# Patient Record
Sex: Male | Born: 1980 | Race: White | Hispanic: No | Marital: Single | Smoking: Never smoker
Health system: Southern US, Community
[De-identification: ages and names within clinical notes are randomized; demographics above are authoritative.]

---

## 2013-09-23 ENCOUNTER — Emergency Department (HOSPITAL_COMMUNITY)
Admission: EM | Admit: 2013-09-23 | Discharge: 2013-09-23 | Disposition: A | Discharge status: Home / Self Care | Payer: Self-pay | Attending: Emergency Medicine | Admitting: Emergency Medicine

## 2013-09-23 ENCOUNTER — Emergency Department (HOSPITAL_COMMUNITY): Payer: Self-pay

## 2013-09-23 ENCOUNTER — Encounter (HOSPITAL_COMMUNITY): Payer: Self-pay | Admitting: Emergency Medicine

## 2013-09-23 DIAGNOSIS — S99919A Unspecified injury of unspecified ankle, initial encounter: Secondary | ICD-10-CM

## 2013-09-23 DIAGNOSIS — S8990XA Unspecified injury of unspecified lower leg, initial encounter: Secondary | ICD-10-CM | POA: Insufficient documentation

## 2013-09-23 DIAGNOSIS — S92309A Fracture of unspecified metatarsal bone(s), unspecified foot, initial encounter for closed fracture: Secondary | ICD-10-CM | POA: Insufficient documentation

## 2013-09-23 DIAGNOSIS — S92302A Fracture of unspecified metatarsal bone(s), left foot, initial encounter for closed fracture: Secondary | ICD-10-CM

## 2013-09-23 DIAGNOSIS — E669 Obesity, unspecified: Secondary | ICD-10-CM | POA: Insufficient documentation

## 2013-09-23 DIAGNOSIS — Y939 Activity, unspecified: Secondary | ICD-10-CM | POA: Insufficient documentation

## 2013-09-23 DIAGNOSIS — S99929A Unspecified injury of unspecified foot, initial encounter: Secondary | ICD-10-CM

## 2013-09-23 DIAGNOSIS — Z791 Long term (current) use of non-steroidal anti-inflammatories (NSAID): Secondary | ICD-10-CM | POA: Insufficient documentation

## 2013-09-23 DIAGNOSIS — Y9289 Other specified places as the place of occurrence of the external cause: Secondary | ICD-10-CM | POA: Insufficient documentation

## 2013-09-23 DIAGNOSIS — W172XXA Fall into hole, initial encounter: Secondary | ICD-10-CM | POA: Insufficient documentation

## 2013-09-23 MED ORDER — IBUPROFEN 800 MG PO TABS
800.0000 mg | ORAL_TABLET | Freq: Once | ORAL | Status: AC
  Administered 2013-09-23: 800 mg via ORAL
  Filled 2013-09-23: qty 1

## 2013-09-23 MED ORDER — IBUPROFEN 800 MG PO TABS: 800.0000 mg | ORAL_TABLET | Freq: Three times a day (TID) | ORAL | Status: AC

## 2013-09-23 MED ORDER — OXYCODONE-ACETAMINOPHEN 5-325 MG PO TABS
1.0000 | ORAL_TABLET | Freq: Once | ORAL | Status: AC
  Administered 2013-09-23: 1 via ORAL
  Filled 2013-09-23: qty 1

## 2013-09-23 MED ORDER — OXYCODONE-ACETAMINOPHEN 5-325 MG PO TABS: 1.0000 | ORAL_TABLET | ORAL | Status: AC | PRN

## 2013-09-23 NOTE — ED Notes (Signed)
Applied ASO, post op shoe, and crutches. Supervised by [employer] Financial[employer].

## 2013-09-23 NOTE — ED Notes (Signed)
Having left foot pain. Larey SeatFell in a hole that the dog had dug per family.

## 2013-09-23 NOTE — Discharge Instructions (Signed)
Metatarsal Fracture, Undisplaced  A metatarsal fracture is a break in the bone(s) of the foot. These are the bones of the foot that connect your toes to the bones of the ankle.  DIAGNOSIS   The diagnoses of these fractures are usually made with X-rays. If there are problems in the forefoot and x-rays are normal a later bone scan will usually make the diagnosis.   TREATMENT AND HOME CARE INSTRUCTIONS  · Treatment may or may not include a cast or walking shoe. When casts are needed the use is usually for short periods of time so as not to slow down healing with muscle wasting (atrophy).  · Activities should be stopped until further advised by your caregiver.  · Wear shoes with adequate shock absorbing capabilities and stiff soles.  · Alternative exercise may be undertaken while waiting for healing. These may include bicycling and swimming, or as your caregiver suggests.  · It is important to keep all follow-up visits or specialty referrals. The failure to keep these appointments could result in improper bone healing and chronic pain or disability.  · Warning: Do not drive a car or operate a motor vehicle until your caregiver specifically tells you it is safe to do so.  IF YOU DO NOT HAVE A CAST OR SPLINT:  · You may walk on your injured foot as tolerated or advised.  · Do not put any weight on your injured foot for as long as directed by your caregiver. Slowly increase the amount of time you walk on the foot as the pain allows or as advised.  · Use crutches until you can bear weight without pain. A gradual increase in weight bearing may help.  · Apply ice to the injury for 15-20 minutes each hour while awake for the first 2 days. Put the ice in a plastic bag and place a towel between the bag of ice and your skin.  · Only take over-the-counter or prescription medicines for pain, discomfort, or fever as directed by your caregiver.  SEEK IMMEDIATE MEDICAL CARE IF:   · Your cast gets damaged or breaks.  · You have  continued severe pain or more swelling than you did before the cast was put on, or the pain is not controlled with medications.  · Your skin or nails below the injury turn blue or grey, or feel cold or numb.  · There is a bad smell, or new stains or pus-like (purulent) drainage coming from the cast.  MAKE SURE YOU:   · Understand these instructions.  · Will watch your condition.  · Will get help right away if you are not doing well or get worse.  Document Released: 10/21/2001 Document Revised: 04/23/2011 Document Reviewed: 09/12/2007  [employer]® Patient Information ©2015 [employer]. This information is not intended to replace advice given to you by your health care provider. Make sure you discuss any questions you have with your health care provider.

## 2013-09-25 NOTE — ED Provider Notes (Signed)
CSN: [id number]     Arrival date & time 09/23/13  2125 History   First MD Initiated Contact with Patient 09/23/13 2155     Chief Complaint  Patient presents with  . Foot Pain     (Consider location/radiation/quality/duration/timing/severity/associated sxs/prior Treatment) Patient is a 33 y.o. male presenting with lower extremity pain. The history is provided by the patient.  Foot Pain Associated symptoms include arthralgias and joint swelling. Pertinent negatives include no chills, fever, neck pain, numbness or weakness.  Nathanuel Cabreja is a 33 y.o. male who presents to the Emergency Department complaining of pain to the left ankle and foot.  He states that he fell in a hole in the yard one day prior to arrival and has been having pain to the outside of his ankle and foot since the fall.  He reports swelling to his foot.  He denies numbness or weakness of the leg, knee pain or other injuries.  Pain is worse with weight bearing and improves with rest.  He has not taken any medications for the pain.    History reviewed. No pertinent past medical history. History reviewed. No pertinent past surgical history. No family history on file. History  Substance Use Topics  . Smoking status: Never Smoker   . Smokeless tobacco: Not on file  . Alcohol Use: No    Review of Systems  Constitutional: Negative for fever and chills.  Genitourinary: Negative for dysuria and difficulty urinating.  Musculoskeletal: Positive for arthralgias and joint swelling. Negative for back pain and neck pain.  Skin: Negative for color change and wound.  Neurological: Negative for dizziness, weakness and numbness.  All other systems reviewed and are negative.     Allergies  Review of patient's allergies indicates no known allergies.  Home Medications   Prior to Admission medications   Medication Sig Start Date End Date Taking? Authorizing Provider  ibuprofen (ADVIL,MOTRIN) 800 MG tablet Take 1 tablet (800 mg  total) by mouth 3 (three) times daily. 09/23/13   Aadin Gaut L. Tasean Mancha, PA-C  oxyCODONE-acetaminophen (PERCOCET/ROXICET) 5-325 MG per tablet Take 1 tablet by mouth every 4 (four) hours as needed. 09/23/13   Said Rueb L. Shalaine Payson, PA-C   BP 149/84  Pulse 91  Temp(Src) 98.3 F (36.8 C) (Oral)  Resp 18  Ht 6\' 4"  (1.93 m)  Wt 415 lb (188.243 kg)  BMI 50.54 kg/m2  SpO2 99% Physical Exam  Nursing note and vitals reviewed. Constitutional: He is oriented to person, place, and time. He appears well-developed and well-nourished. No distress.  Pt is obese  HENT:  Head: Normocephalic and atraumatic.  Neck: Normal range of motion. Neck supple.  Cardiovascular: Normal rate, regular rhythm, normal heart sounds and intact distal pulses.   No murmur heard. Pulmonary/Chest: Effort normal and breath sounds normal. No respiratory distress.  Musculoskeletal: He exhibits edema and tenderness.  ttp of the lateral left foot with localized edema at the proximal fifth MT joint.  mild STS and tenderness at the lateral malleolus. DP pulse is brisk,distal sensation intact.  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  No proximal tenderness.  Neurological: He is alert and oriented to person, place, and time. He exhibits normal muscle tone. Coordination normal.  Skin: Skin is warm and dry.    ED Course  Procedures (including critical care time) Labs Review Labs Reviewed - No data to display  Imaging Review Dg Ankle Complete Left  09/23/2013   CLINICAL DATA:  Left lateral foot and ankle pain and swelling.  EXAM: LEFT ANKLE COMPLETE - 3+ VIEW  COMPARISON:  None.  FINDINGS: There is no evidence of fracture or dislocation. The ankle mortise is intact; the interosseous space is within normal limits. No talar tilt or subluxation is seen. A small posterior calcaneal spur is seen.  The joint spaces are preserved. No significant soft tissue abnormalities are seen.  IMPRESSION: No evidence of fracture or dislocation.    Electronically Signed   By: Roanna RaiderJeffery  Chang M.D.   On: 09/23/2013 22:55   Dg Foot Complete Left  09/23/2013   CLINICAL DATA:  Left foot pain.  EXAM: LEFT FOOT - COMPLETE 3+ VIEW  COMPARISON:  None.  FINDINGS: There appears to be a Jones fracture through the base of the fifth metatarsal, with mild displacement. No additional fractures are seen.  The joint spaces are preserved. There is no evidence of talar subluxation; the subtalar joint is unremarkable in appearance. Plantar and posterior calcaneal spurs are seen.  No significant soft tissue abnormalities are seen.  IMPRESSION: Apparent Jones fracture through the base of the fifth metatarsal, with mild displacement.   Electronically Signed   By: Roanna RaiderJeffery  Chang M.D.   On: 09/23/2013 22:58     EKG Interpretation None      MDM   Final diagnoses:  Fracture of metatarsal bone of left foot, closed, initial encounter    Patient was placed in a ASO and post op shoe.  Crutches given  He agrees to elevate, ice and close f/u with orthopedics.  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 Trisha Mangleriplett, PA-C 09/25/13 1701

## 2013-09-27 NOTE — ED Provider Notes (Signed)
Medical screening examination/treatment/procedure(s) were performed by non-physician practitioner and as supervising physician I was immediately available for consultation/collaboration.   EKG Interpretation None      Budd Freiermuth, MD, FACEP   Vermelle Cammarata L Rubin Dais, MD 09/27/13 0701 

## 2015-03-22 IMAGING — CR DG ANKLE COMPLETE 3+V*L*
3 series · 3 of 3 positions shown · non-contrast
Comparison: None.

CLINICAL DATA: Left lateral foot and ankle pain and swelling.

EXAM:
LEFT ANKLE COMPLETE - 3+ VIEW

[view not recorded (1 of 3)]
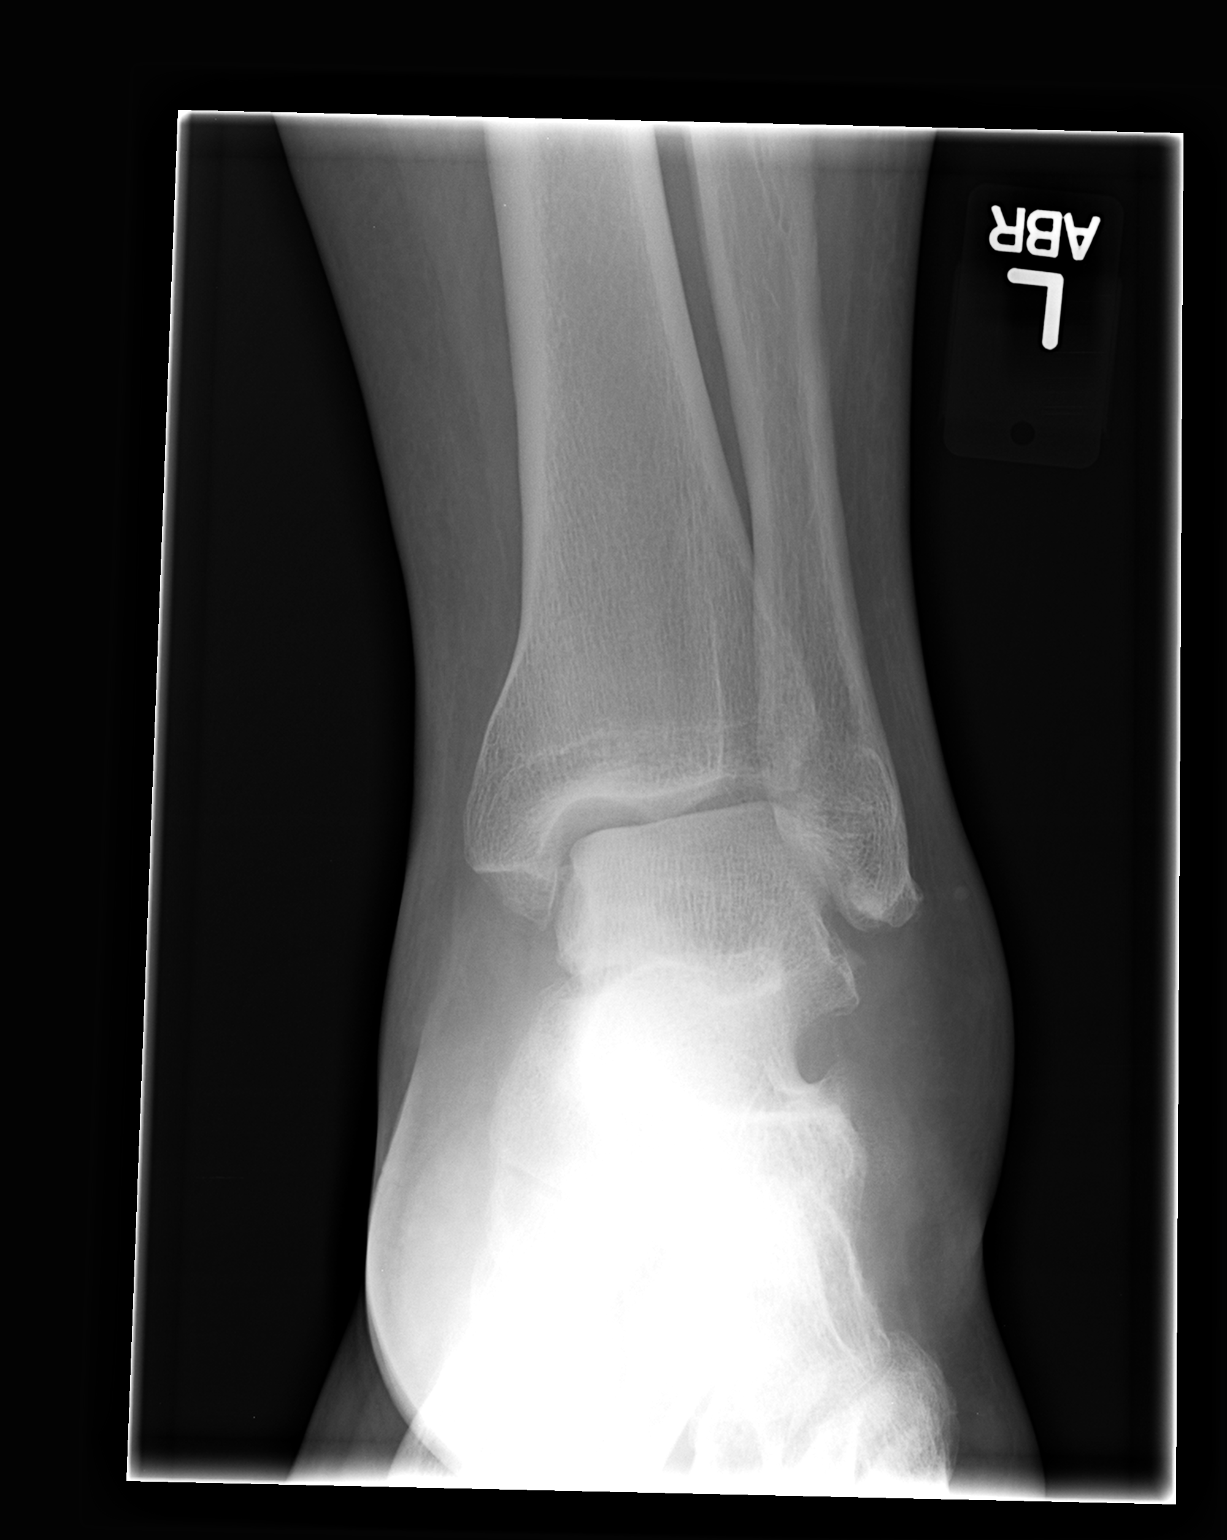

[view not recorded (2 of 3)]
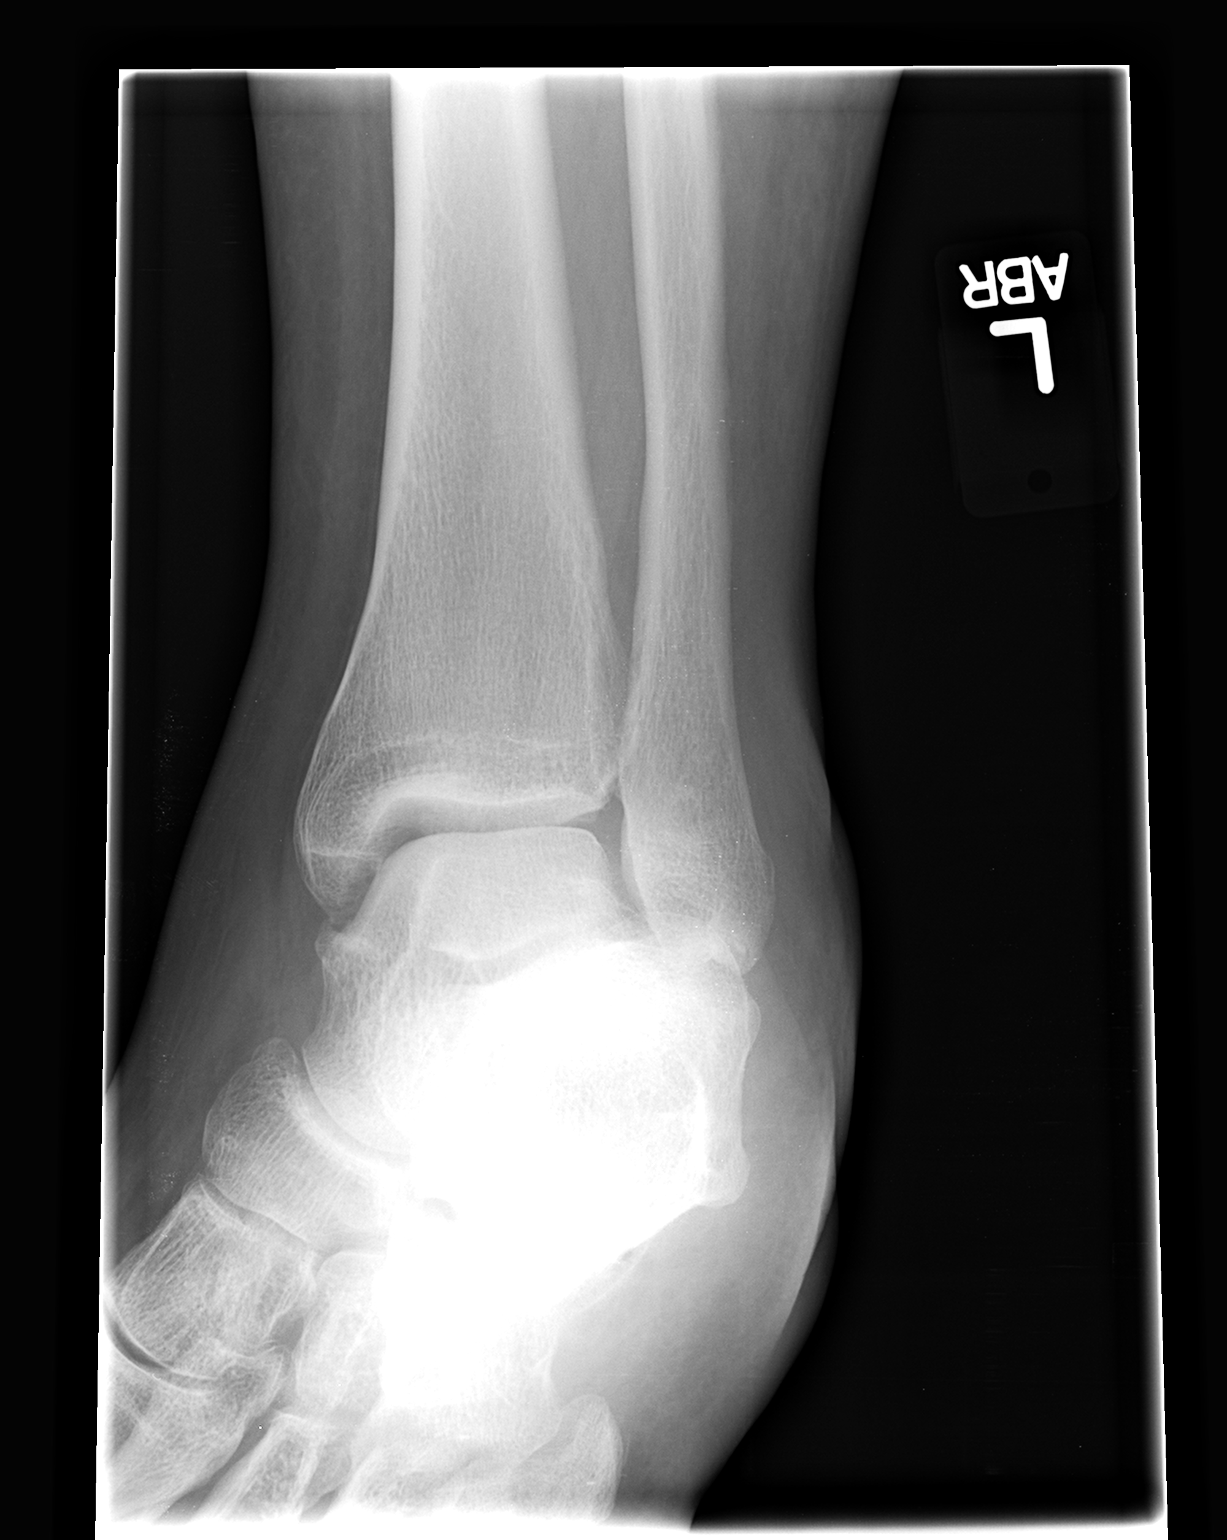

[view not recorded (3 of 3)]
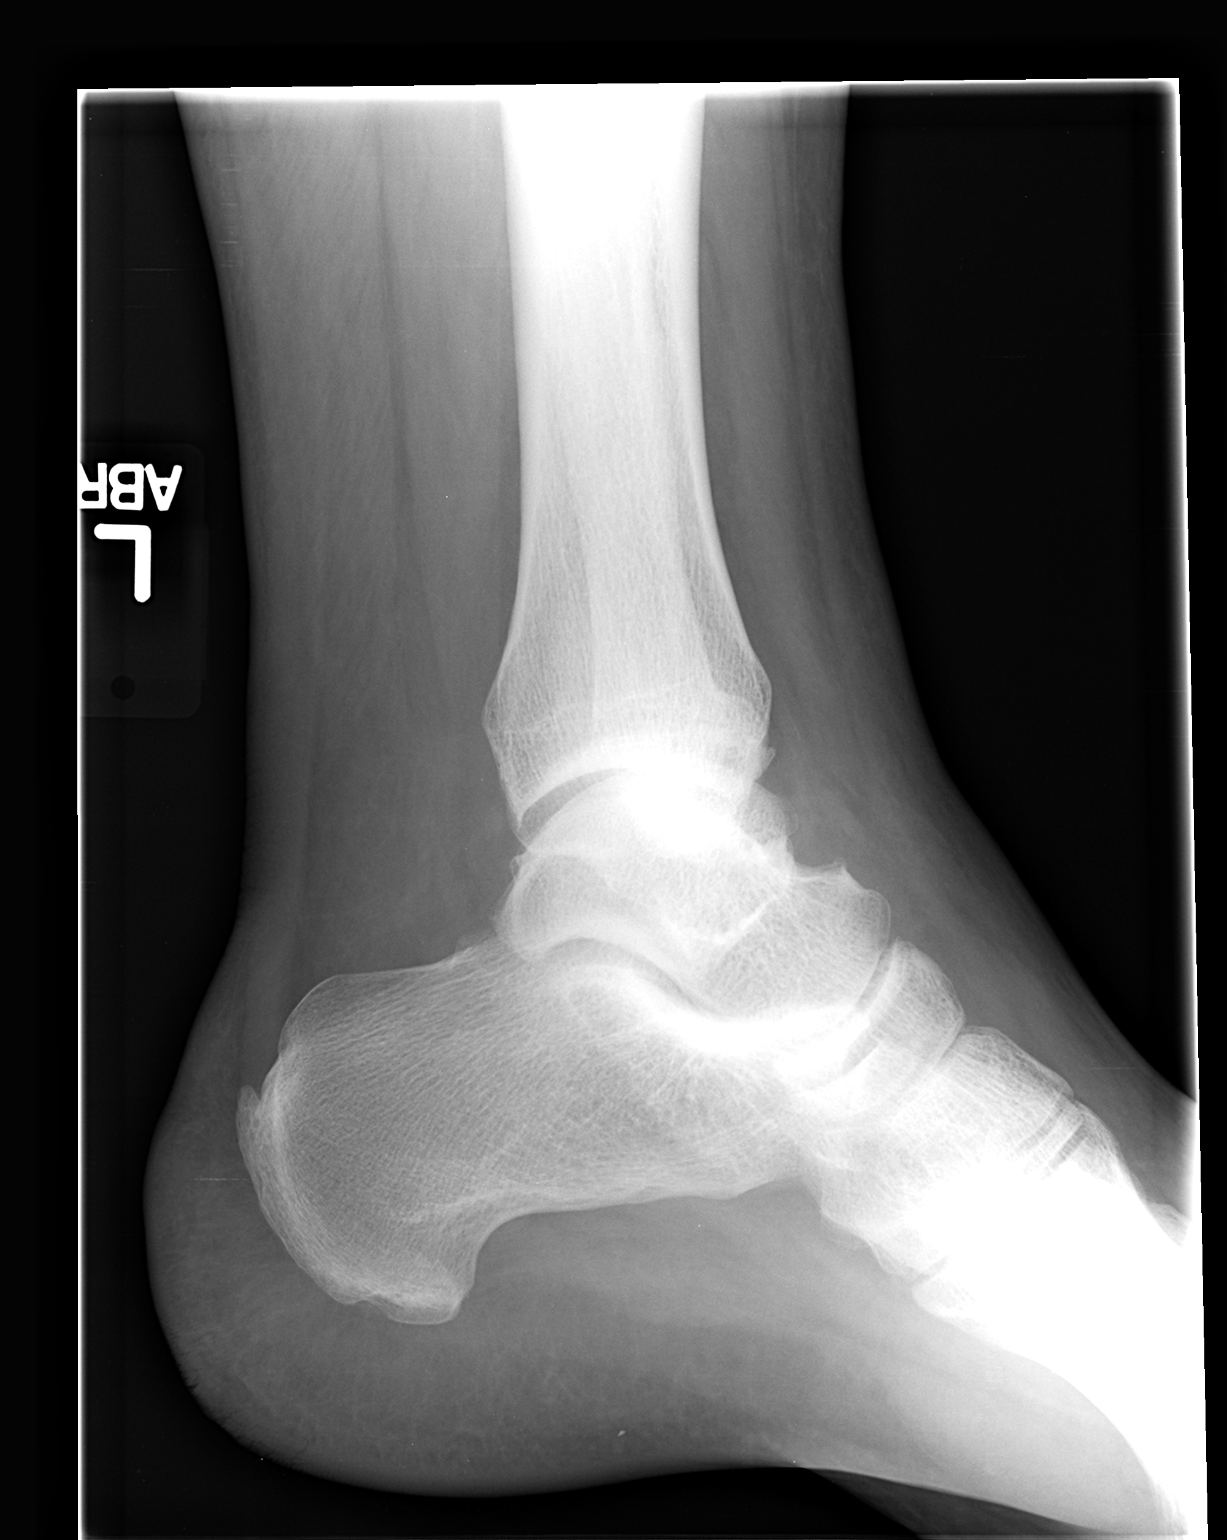

[3 of 3 positions shown; findings below may reference images not displayed]

FINDINGS: There is no evidence of fracture or dislocation. The ankle mortise
is intact; the interosseous space is within normal limits. No talar
tilt or subluxation is seen. A small posterior calcaneal spur is
seen.

The joint spaces are preserved. No significant soft tissue
abnormalities are seen.
IMPRESSION: No evidence of fracture or dislocation.

## 2018-12-30 ENCOUNTER — Other Ambulatory Visit: Payer: Self-pay

## 2018-12-30 DIAGNOSIS — Z20822 Contact with and (suspected) exposure to covid-19: Secondary | ICD-10-CM

## 2019-01-05 LAB — NOVEL CORONAVIRUS, NAA: SARS-CoV-2, NAA: DETECTED — AB
